# Patient Record
Sex: Male | Born: 1960 | Race: Black or African American | Hispanic: No | Marital: Married | Smoking: Never smoker
Health system: Southern US, Community
[De-identification: ages and names within clinical notes are randomized; demographics above are authoritative.]

## PROBLEM LIST (undated history)

## (undated) DIAGNOSIS — E119 Type 2 diabetes mellitus without complications: Secondary | ICD-10-CM

---

## 1998-02-13 ENCOUNTER — Emergency Department (HOSPITAL_COMMUNITY): Admission: EM | Admit: 1998-02-13 | Discharge: 1998-02-13 | Payer: Self-pay | Admitting: *Deleted

## 2001-02-21 ENCOUNTER — Emergency Department (HOSPITAL_COMMUNITY): Admission: EM | Admit: 2001-02-21 | Discharge: 2001-02-21 | Payer: Self-pay | Admitting: Emergency Medicine

## 2001-02-21 ENCOUNTER — Encounter: Payer: Self-pay | Admitting: Emergency Medicine

## 2001-11-01 ENCOUNTER — Emergency Department (HOSPITAL_COMMUNITY): Admission: EM | Admit: 2001-11-01 | Discharge: 2001-11-01 | Payer: Self-pay | Admitting: *Deleted

## 2002-01-06 ENCOUNTER — Encounter: Payer: Self-pay | Admitting: Emergency Medicine

## 2002-01-06 ENCOUNTER — Emergency Department (HOSPITAL_COMMUNITY): Admission: EM | Admit: 2002-01-06 | Discharge: 2002-01-06 | Payer: Self-pay | Admitting: Emergency Medicine

## 2003-01-04 ENCOUNTER — Emergency Department (HOSPITAL_COMMUNITY): Admission: EM | Admit: 2003-01-04 | Discharge: 2003-01-04 | Payer: Self-pay | Admitting: *Deleted

## 2003-06-28 ENCOUNTER — Encounter: Payer: Self-pay | Admitting: Internal Medicine

## 2003-06-28 ENCOUNTER — Encounter: Admission: RE | Admit: 2003-06-28 | Discharge: 2003-06-28 | Payer: Self-pay | Admitting: Internal Medicine

## 2004-09-13 ENCOUNTER — Encounter: Admission: RE | Admit: 2004-09-13 | Discharge: 2004-09-13 | Payer: Self-pay | Admitting: Occupational Medicine

## 2004-10-01 ENCOUNTER — Encounter: Admission: RE | Admit: 2004-10-01 | Discharge: 2004-11-04 | Payer: Self-pay | Admitting: Nurse Practitioner

## 2005-09-08 ENCOUNTER — Emergency Department (HOSPITAL_COMMUNITY): Admission: EM | Admit: 2005-09-08 | Discharge: 2005-09-08 | Payer: Self-pay | Admitting: Family Medicine

## 2006-09-04 ENCOUNTER — Ambulatory Visit (HOSPITAL_COMMUNITY): Admission: RE | Admit: 2006-09-04 | Discharge: 2006-09-04 | Payer: Self-pay | Admitting: Podiatry

## 2006-12-07 ENCOUNTER — Encounter: Admission: RE | Admit: 2006-12-07 | Discharge: 2006-12-30 | Payer: Self-pay | Admitting: Orthopedic Surgery

## 2006-12-17 ENCOUNTER — Ambulatory Visit (HOSPITAL_COMMUNITY): Admission: RE | Admit: 2006-12-17 | Discharge: 2006-12-18 | Payer: Self-pay | Admitting: Orthopedic Surgery

## 2006-12-17 ENCOUNTER — Encounter (INDEPENDENT_AMBULATORY_CARE_PROVIDER_SITE_OTHER): Payer: Self-pay | Admitting: *Deleted

## 2007-09-16 ENCOUNTER — Emergency Department (HOSPITAL_COMMUNITY): Admission: EM | Admit: 2007-09-16 | Discharge: 2007-09-16 | Payer: Self-pay | Admitting: Emergency Medicine

## 2007-10-22 ENCOUNTER — Encounter: Admission: RE | Admit: 2007-10-22 | Discharge: 2007-10-22 | Payer: Self-pay | Admitting: Occupational Medicine

## 2008-08-28 ENCOUNTER — Encounter: Admission: RE | Admit: 2008-08-28 | Discharge: 2008-08-28 | Payer: Self-pay | Admitting: Occupational Medicine

## 2008-09-11 ENCOUNTER — Encounter: Admission: RE | Admit: 2008-09-11 | Discharge: 2008-09-11 | Payer: Self-pay | Admitting: Internal Medicine

## 2008-09-22 ENCOUNTER — Ambulatory Visit (HOSPITAL_COMMUNITY): Admission: RE | Admit: 2008-09-22 | Discharge: 2008-09-22 | Payer: Self-pay | Admitting: Internal Medicine

## 2008-10-09 ENCOUNTER — Ambulatory Visit: Payer: Self-pay | Admitting: Sports Medicine

## 2008-10-09 DIAGNOSIS — S93409A Sprain of unspecified ligament of unspecified ankle, initial encounter: Secondary | ICD-10-CM | POA: Insufficient documentation

## 2008-10-09 DIAGNOSIS — M25579 Pain in unspecified ankle and joints of unspecified foot: Secondary | ICD-10-CM

## 2008-10-09 DIAGNOSIS — M2140 Flat foot [pes planus] (acquired), unspecified foot: Secondary | ICD-10-CM | POA: Insufficient documentation

## 2008-10-09 DIAGNOSIS — M7750 Other enthesopathy of unspecified foot: Secondary | ICD-10-CM | POA: Insufficient documentation

## 2008-10-10 ENCOUNTER — Encounter: Payer: Self-pay | Admitting: Sports Medicine

## 2008-10-18 ENCOUNTER — Encounter: Payer: Self-pay | Admitting: Sports Medicine

## 2008-11-06 ENCOUNTER — Ambulatory Visit: Payer: Self-pay | Admitting: Sports Medicine

## 2008-11-10 ENCOUNTER — Encounter (INDEPENDENT_AMBULATORY_CARE_PROVIDER_SITE_OTHER): Payer: Self-pay | Admitting: *Deleted

## 2008-11-10 ENCOUNTER — Ambulatory Visit: Payer: Self-pay | Admitting: Sports Medicine

## 2008-11-10 DIAGNOSIS — E669 Obesity, unspecified: Secondary | ICD-10-CM | POA: Insufficient documentation

## 2008-11-24 ENCOUNTER — Ambulatory Visit: Payer: Self-pay | Admitting: Sports Medicine

## 2008-12-29 ENCOUNTER — Ambulatory Visit: Payer: Self-pay | Admitting: Sports Medicine

## 2009-01-26 ENCOUNTER — Ambulatory Visit: Payer: Self-pay | Admitting: Sports Medicine

## 2009-01-29 ENCOUNTER — Ambulatory Visit (HOSPITAL_COMMUNITY): Admission: RE | Admit: 2009-01-29 | Discharge: 2009-01-29 | Payer: Self-pay | Admitting: Internal Medicine

## 2009-02-06 ENCOUNTER — Encounter: Payer: Self-pay | Admitting: Sports Medicine

## 2009-03-09 ENCOUNTER — Ambulatory Visit: Payer: Self-pay | Admitting: Sports Medicine

## 2010-01-09 IMAGING — CR DG ANKLE COMPLETE 3+V*L*
3 series · 3 of 3 positions shown · non-contrast
Comparison: CT of the left ankle of 09/04/2006

CLINICAL DATA: Twisted ankle with severe pain laterally

LEFT ANKLE COMPLETE - 3+ VIEW

[view not recorded (1 of 3)]
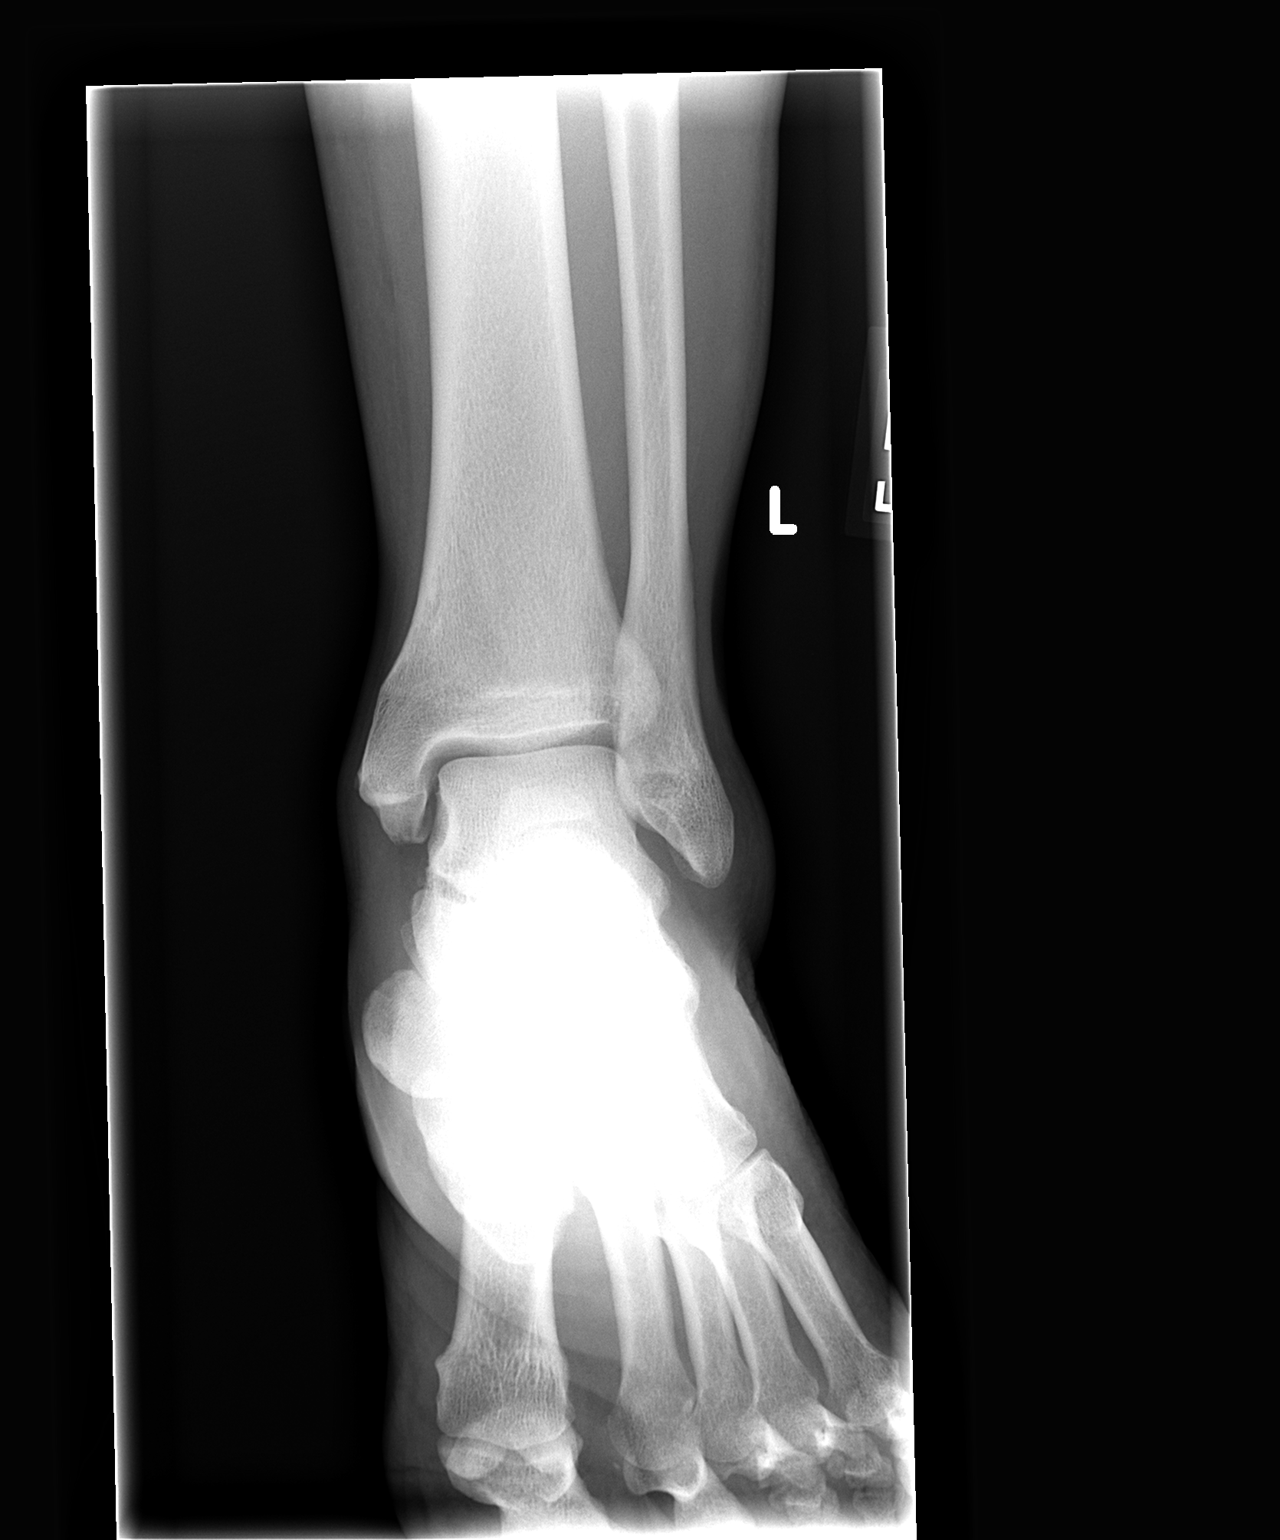

[view not recorded (2 of 3)]
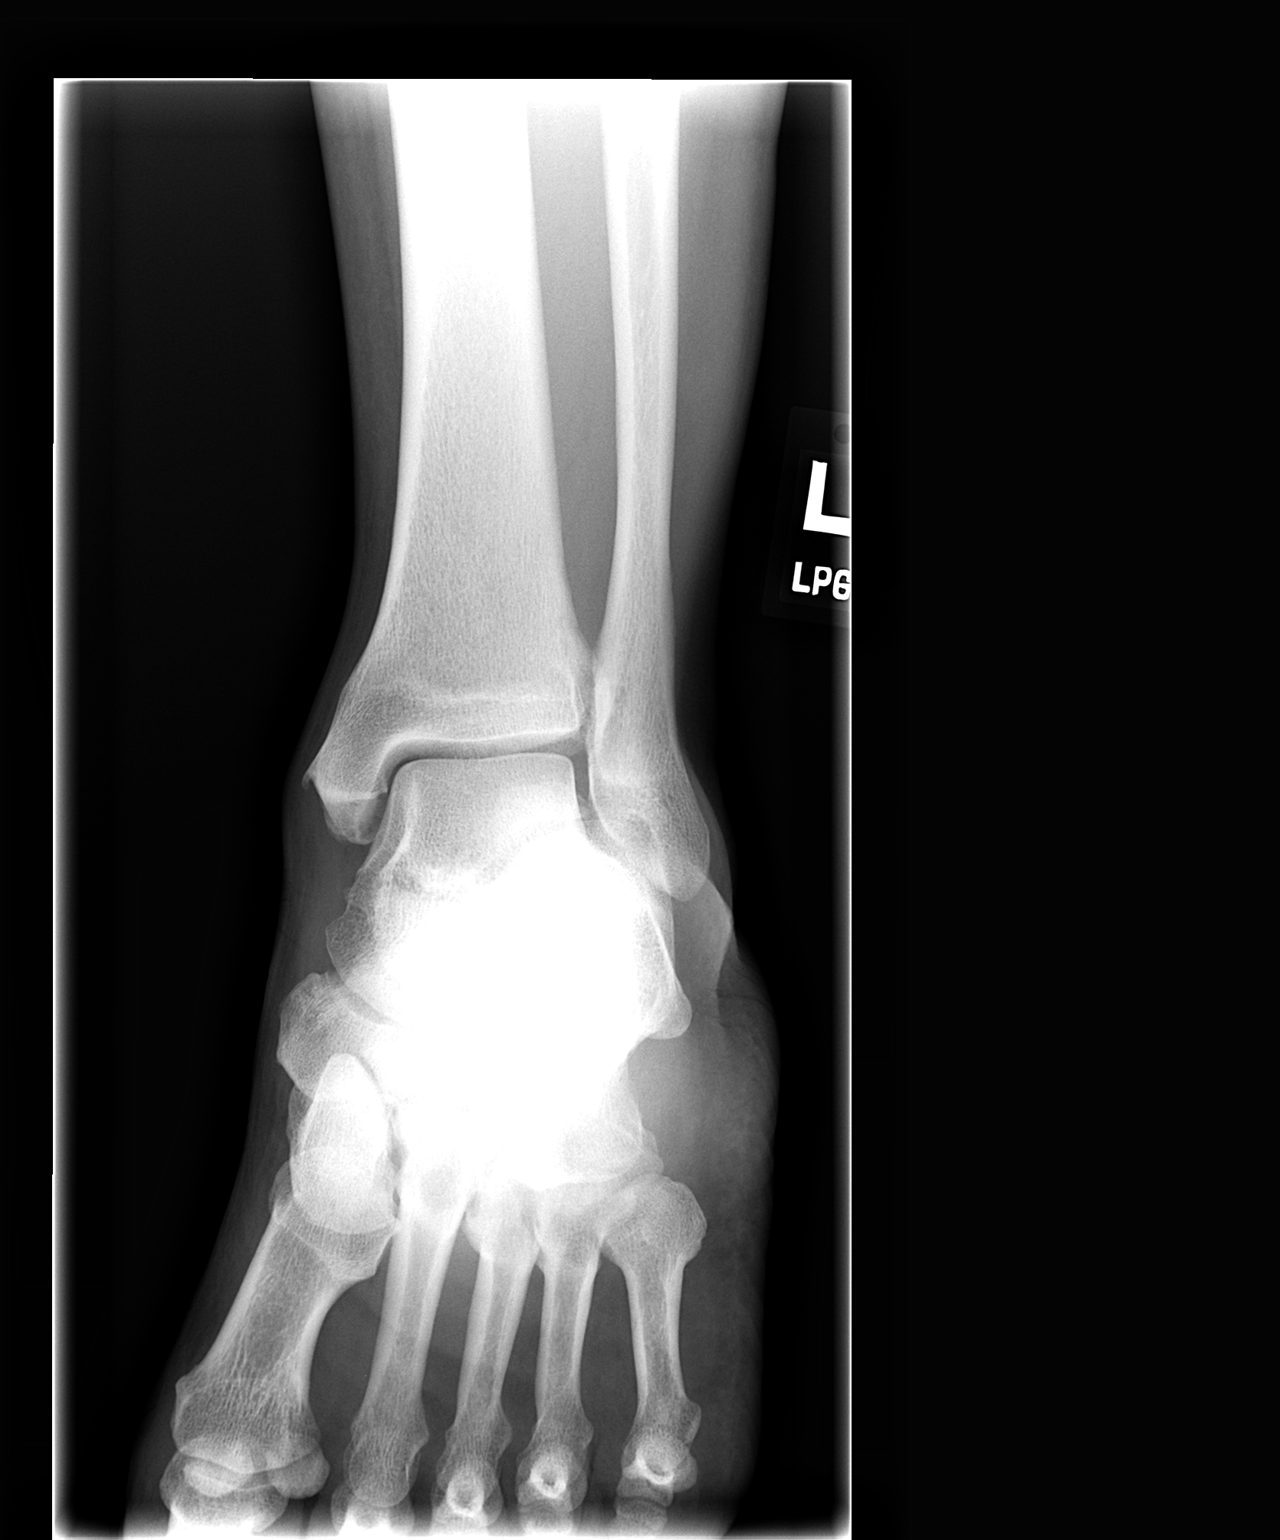

[view not recorded (3 of 3)]
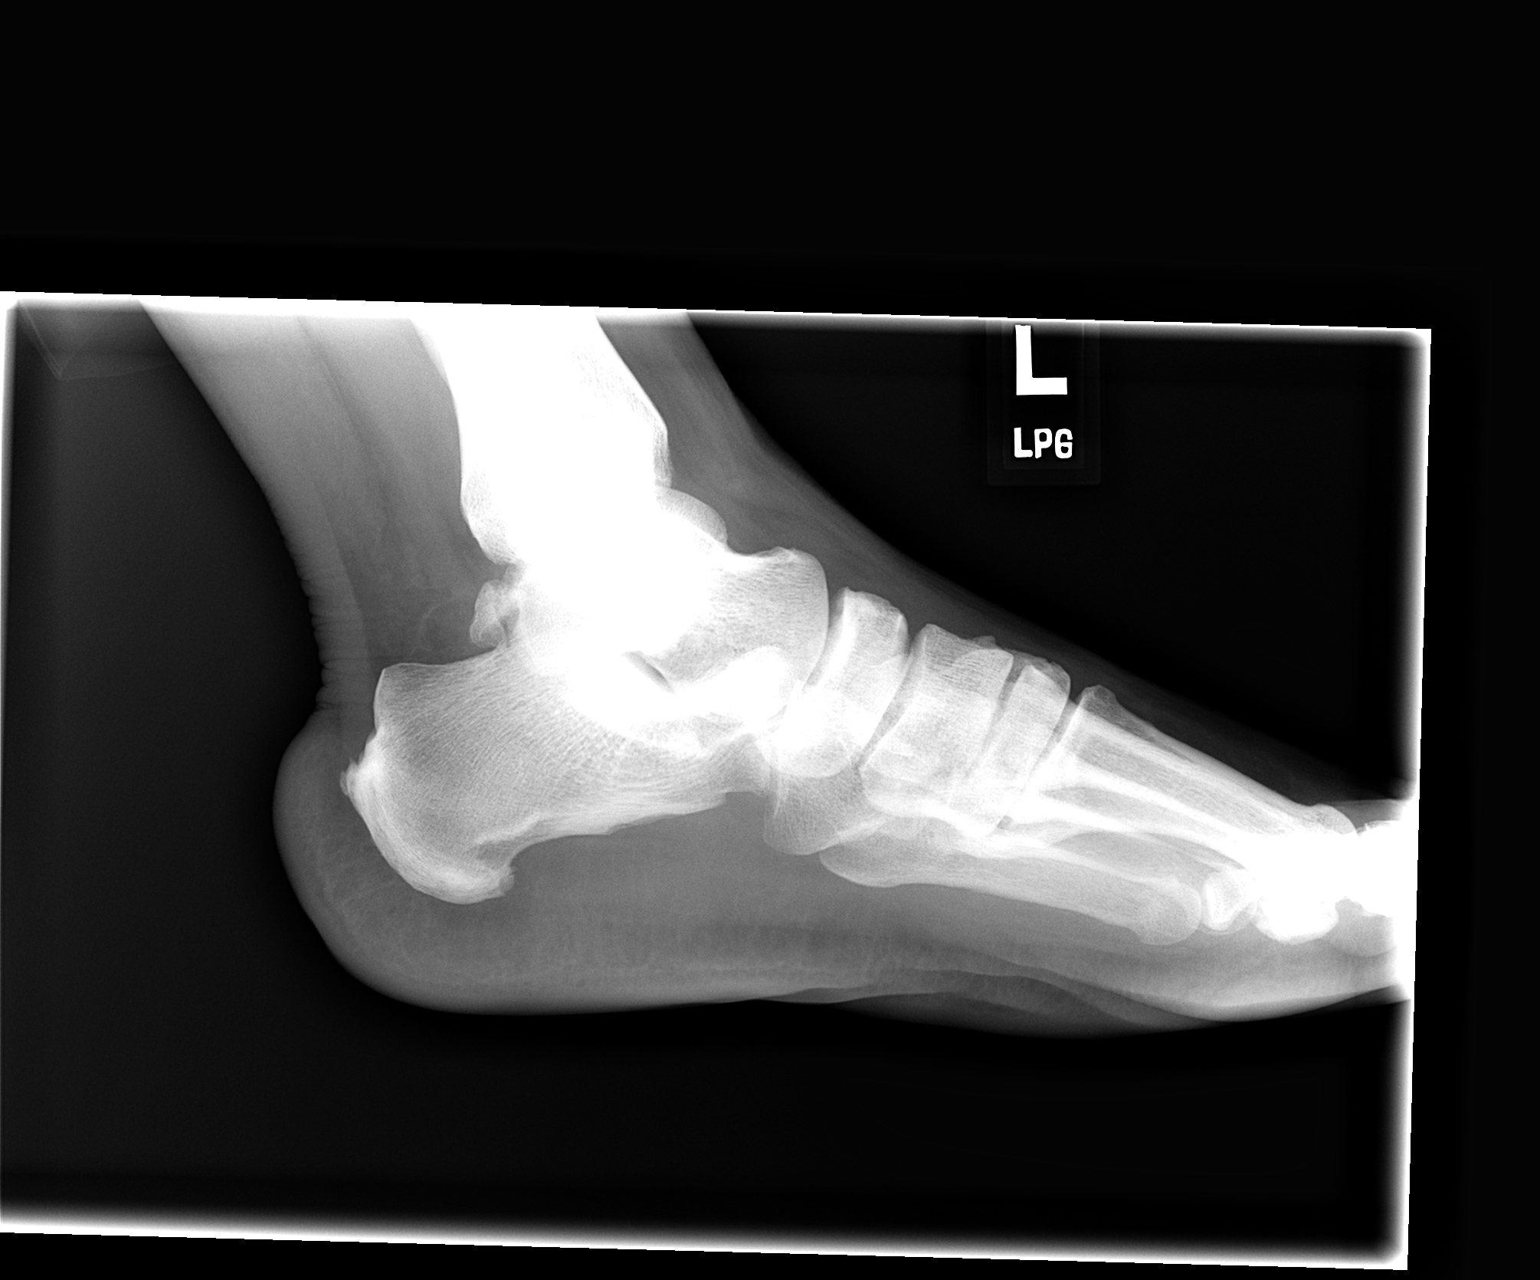

[3 of 3 positions shown; findings below may reference images not displayed]

FINDINGS: No acute fracture is seen and the ankle joint appears
normal.  Alignment is normal.
IMPRESSION: No acute abnormality.

## 2011-02-24 ENCOUNTER — Encounter: Payer: Self-pay | Admitting: *Deleted

## 2011-03-21 NOTE — Op Note (Signed)
NAMEDONTRAY, [place]ABER[place] NO.:  [id number]   MEDICAL RECORD NO.:  [id number]          PATIENT TYPE:  INP   LOCATION:  1515                         FACILITY:  [facility]   PHYSICIAN:  Marlowe Kays, M.D.  DATE OF BIRTH:  1961/04/20   DATE OF PROCEDURE:  12/17/2006  DATE OF DISCHARGE:                               OPERATIVE REPORT   PREOP DIAGNOSIS:  Painful osteochondral lesion right talus.   POSTOP DIAGNOSIS:  Painful osteochondral lesion right talus.   OPERATION:  Excision of osteochondral lesion right talus with repair of  Achilles tendon.   SURGEON:  Marlowe Kays, M.D.   ASSISTANTDruscilla Brownie. Idolina Primer, P.A.-C   ANESTHESIA:  General   INDICATION FOR PROCEDURE:  He has had pain in his right ankle and heel  area for a number of years, worse recently.  Workup including CT scan  neurologic testing and inflammatory testing was all negative.  It was  not until we obtained an MRI of the ankle on 11/05/2006 that we could  see an 11 x 6 mm osteochondral lesion in the posterior medial talus with  extensive surrounding edema in the talus.  He is here today for surgical  correction.  Because of the location of the lesion which was posterior  to the superior dome of the talus, I had to go in posteriorly; which  necessitated cutting the Achilles tendon with repair.   DESCRIPTION OF PROCEDURE:  Satisfactory spinal anesthesia, pneumatic  tourniquet applied while the supine position.  He was then placed in the  prone position on rolls and the right leg Esmarched out, nonsterilely,  and the tourniquet inflated to 350 mmHg.  I made a vertical incision  lateral to the Achilles tendon, which I isolated; and then cut it in Z-  fashion tagging the two ends for repair later.  With a combination of  blunt sharp dissection and using a C-arm, I explored the posterior ankle  retracting the flexor halluces medially to protect the neurovascular  bundle.  The talotibial joint was  located; and with sequential x-rays.  I kept mobilizing the soft tissues until I was able to get to the medial  talus.  At this point, the articular surface had a corrugated  appearance, roughly the size of the lesion.  A localizing x-ray with  needle and C-arm indicated that we were right where the lesion was  located on the MRI.  Once, again, there was nothing visible on plain x-  ray.   I made a trap-door window with 1/4-inch curved osteotome, and saved the  trap-door for replanting later.  I then used several curettes, curetting  out what was a very soft bone marrow-type material.  There may have been  some yellowish areas in it.  No definite cyst was located; but after  curetting out a size of lesion that was roughly what was noted on the  MRI, the surrounding perimeter of the curetted area was all solid, which  was completely different from what I had curetted out.  I then irrigated  the wound well with sterile saline, and packed cancellus allograft  into  the scooped out area; and then replaced the trap door.  I impacted  slightly, but also placed Gelfoam on top to help secure it.  There wound  was then once again irrigated well.  The heel cord Z-cut was repaired  with multiple interrupted #1 Ethibond with the ankle in plantar flexion.  The subcu tissue with 2-0 Vicryl, the skin with running 3-0 nylon talar  stitch.  Betadine prep Adaptic, and a dry sterile dressing, and a well-  padded short-leg splint cast with the ankle in somewhat __________ was  applied.  Tourniquet was released.  He tolerated the procedure well; and  was taken to the recovery room in satisfactory condition with no known  complications.           ______________________________  Marlowe Kays, M.D.     JA/MEDQ  D:  12/17/2006  T:  12/17/2006  Job:  [id number]

## 2013-05-17 ENCOUNTER — Encounter (HOSPITAL_COMMUNITY): Payer: Self-pay | Admitting: Family Medicine

## 2013-05-17 ENCOUNTER — Emergency Department (HOSPITAL_COMMUNITY)
Admission: EM | Admit: 2013-05-17 | Discharge: 2013-05-17 | Disposition: A | Discharge status: Home / Self Care | Payer: No Typology Code available for payment source | Attending: Emergency Medicine | Admitting: Emergency Medicine

## 2013-05-17 ENCOUNTER — Emergency Department (HOSPITAL_COMMUNITY): Payer: No Typology Code available for payment source

## 2013-05-17 DIAGNOSIS — IMO0002 Reserved for concepts with insufficient information to code with codable children: Secondary | ICD-10-CM | POA: Insufficient documentation

## 2013-05-17 DIAGNOSIS — Z794 Long term (current) use of insulin: Secondary | ICD-10-CM | POA: Insufficient documentation

## 2013-05-17 DIAGNOSIS — S0993XA Unspecified injury of face, initial encounter: Secondary | ICD-10-CM | POA: Insufficient documentation

## 2013-05-17 DIAGNOSIS — Y9389 Activity, other specified: Secondary | ICD-10-CM | POA: Insufficient documentation

## 2013-05-17 DIAGNOSIS — Z7982 Long term (current) use of aspirin: Secondary | ICD-10-CM | POA: Insufficient documentation

## 2013-05-17 DIAGNOSIS — Y92410 Unspecified street and highway as the place of occurrence of the external cause: Secondary | ICD-10-CM | POA: Insufficient documentation

## 2013-05-17 DIAGNOSIS — E119 Type 2 diabetes mellitus without complications: Secondary | ICD-10-CM | POA: Insufficient documentation

## 2013-05-17 DIAGNOSIS — S199XXA Unspecified injury of neck, initial encounter: Secondary | ICD-10-CM | POA: Insufficient documentation

## 2013-05-17 DIAGNOSIS — Z79899 Other long term (current) drug therapy: Secondary | ICD-10-CM | POA: Insufficient documentation

## 2013-05-17 HISTORY — DX: Type 2 diabetes mellitus without complications: E11.9

## 2013-05-17 MED ORDER — OXYCODONE-ACETAMINOPHEN 5-325 MG PO TABS
2.0000 | ORAL_TABLET | Freq: Once | ORAL | Status: AC
  Administered 2013-05-17: 2 via ORAL
  Filled 2013-05-17: qty 2

## 2013-05-17 MED ORDER — ONDANSETRON 4 MG PO TBDP
8.0000 mg | ORAL_TABLET | Freq: Once | ORAL | Status: AC
  Administered 2013-05-17: 8 mg via ORAL
  Filled 2013-05-17: qty 2

## 2013-05-17 MED ORDER — PROMETHAZINE HCL 25 MG PO TABS: 25.0000 mg | ORAL_TABLET | Freq: Four times a day (QID) | ORAL | Status: DC | PRN

## 2013-05-17 MED ORDER — OXYCODONE-ACETAMINOPHEN 5-325 MG PO TABS: 2.0000 | ORAL_TABLET | ORAL | Status: DC | PRN

## 2013-05-17 NOTE — ED Notes (Signed)
PT returned from CT
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
## 2013-05-18 NOTE — ED Provider Notes (Signed)
Medical screening examination/treatment/procedure(s) were performed by non-physician practitioner and as supervising physician I was immediately available for consultation/collaboration.  Taleyah Hillman T Jaysa Kise, MD 05/18/13 1531 

## 2014-07-12 ENCOUNTER — Encounter: Payer: Self-pay | Admitting: Podiatry

## 2014-07-12 ENCOUNTER — Ambulatory Visit (INDEPENDENT_AMBULATORY_CARE_PROVIDER_SITE_OTHER): Payer: BC Managed Care – PPO | Admitting: Podiatry

## 2014-07-12 VITALS — BP 122/76 | HR 73 | Resp 17

## 2014-07-12 DIAGNOSIS — M79609 Pain in unspecified limb: Secondary | ICD-10-CM

## 2014-07-12 DIAGNOSIS — M79676 Pain in unspecified toe(s): Secondary | ICD-10-CM

## 2014-07-12 DIAGNOSIS — B351 Tinea unguium: Secondary | ICD-10-CM

## 2014-07-12 NOTE — Patient Instructions (Signed)
Diabetes and Foot Care Diabetes may cause you to have problems because of poor blood supply (circulation) to your feet and legs. This may cause the skin on your feet to become thinner, break easier, and heal more slowly. Your skin may become dry, and the skin may peel and crack. You may also have nerve damage in your legs and feet causing decreased feeling in them. You may not notice minor injuries to your feet that could lead to infections or more serious problems. Taking care of your feet is one of the most important things you can do for yourself.  HOME CARE INSTRUCTIONS  Wear shoes at all times, even in the house. Do not go barefoot. Bare feet are easily injured.  Check your feet daily for blisters, cuts, and redness. If you cannot see the bottom of your feet, use a mirror or ask someone for help.  Wash your feet with warm water (do not use hot water) and mild soap. Then pat your feet and the areas between your toes until they are completely dry. Do not soak your feet as this can dry your skin.  Apply a moisturizing lotion or petroleum jelly (that does not contain alcohol and is unscented) to the skin on your feet and to dry, brittle toenails. Do not apply lotion between your toes.  Trim your toenails straight across. Do not dig under them or around the cuticle. File the edges of your nails with an emery board or nail file.  Do not cut corns or calluses or try to remove them with medicine.  Wear clean socks or stockings every day. Make sure they are not too tight. Do not wear knee-high stockings since they may decrease blood flow to your legs.  Wear shoes that fit properly and have enough cushioning. To break in new shoes, wear them for just a few hours a day. This prevents you from injuring your feet. Always look in your shoes before you put them on to be sure there are no objects inside.  Do not cross your legs. This may decrease the blood flow to your feet.  If you find a minor scrape,  cut, or break in the skin on your feet, keep it and the skin around it clean and dry. These areas may be cleansed with mild soap and water. Do not cleanse the area with peroxide, alcohol, or iodine.  When you remove an adhesive bandage, be sure not to damage the skin around it.  If you have a wound, look at it several times a day to make sure it is healing.  Do not use heating pads or hot water bottles. They may burn your skin. If you have lost feeling in your feet or legs, you may not know it is happening until it is too late.  Make sure your health care provider performs a complete foot exam at least annually or more often if you have foot problems. Report any cuts, sores, or bruises to your health care provider immediately. SEEK MEDICAL CARE IF:   You have an injury that is not healing.  You have cuts or breaks in the skin.  You have an ingrown nail.  You notice redness on your legs or feet.  You feel burning or tingling in your legs or feet.  You have pain or cramps in your legs and feet.  Your legs or feet are numb.  Your feet always feel cold. SEEK IMMEDIATE MEDICAL CARE IF:   There is increasing redness,   swelling, or pain in or around a wound.  There is a red line that goes up your leg.  Pus is coming from a wound.  You develop a fever or as directed by your health care provider.  You notice a bad smell coming from an ulcer or wound. Document Released: 10/17/2000 Document Revised: 06/22/2013 Document Reviewed: 03/29/2013 ExitCare Patient Information 2015 [employer]. This information is not intended to replace advice given to you by your health care provider. Make sure you discuss any questions you have with your health care provider.  

## 2014-07-12 NOTE — Progress Notes (Signed)
   Subjective:    Patient ID: Anthony Robertson, male    DOB: 1961/02/08, 53 y.o.   MRN: [id number]  HPI Pt presents for diabetic foot exam, states that his blood sugars are under control, his last A1c was around 9. Denies any foot pain or issues. Denies any claudication symptoms or tingling/numbness. States he works out about 45 minutes a day for cardiovascular exercise. No complaints at this time.   Review of Systems  All other systems reviewed and are negative.      Objective:   Physical Exam AAO x3, NAD DP/PT pulses palpable 2/4 b/l. CRT , 3 < 3sec Protective sensation intact with Simms Weinstein monofilament, vibratory sensation intact, Achilles tendon reflex intact. Bilateral hallux, second, fifth digit nails hypertrophic, dystrophic, elongated, discolored. No surrounding erythema or drainage. No hyperkeratotic lesions. No ulcerative or pre-ulcerative lesions. No leg pain, swelling, warmth. MMT 5/5, ROM WNL. Decrease in medial arch height upon weightbearing bilaterally. Equinus.      Assessment & Plan:  53 year old male with history diabetes and symptomatic onychomycosis/nail dystrophy.  -Treatment options discussed including alternatives, risks, complications.  -Nails sharply debrided without complications.  -Recommend a supportive shoe gear help support his foot type. -Discussed over-the-counter versus prescription therapy for possible onychomycosis. At this time we'll proceed with over-the-counter therapy. -The patient is doing well and no history of ulceration, PVD, neuropathy. Recommend at least yearly followup for diabetic risk assessment.  -Call sooner with any questions or concerns or any change in symptoms.

## 2018-07-29 ENCOUNTER — Ambulatory Visit
Admission: RE | Admit: 2018-07-29 | Discharge: 2018-07-29 | Disposition: A | Discharge status: Home / Self Care | Payer: BC Managed Care – PPO | Source: Ambulatory Visit | Attending: Family | Admitting: Family

## 2018-07-29 ENCOUNTER — Other Ambulatory Visit: Payer: Self-pay | Admitting: Family

## 2018-07-29 DIAGNOSIS — R05 Cough: Secondary | ICD-10-CM

## 2018-07-29 DIAGNOSIS — R059 Cough, unspecified: Secondary | ICD-10-CM

## 2018-08-02 ENCOUNTER — Encounter: Payer: Self-pay | Admitting: *Deleted

## 2018-08-02 ENCOUNTER — Ambulatory Visit (INDEPENDENT_AMBULATORY_CARE_PROVIDER_SITE_OTHER): Payer: BC Managed Care – PPO | Admitting: Internal Medicine

## 2018-08-02 ENCOUNTER — Encounter: Payer: Self-pay | Admitting: Internal Medicine

## 2018-08-02 VITALS — BP 140/84 | HR 110 | Ht 66.25 in | Wt 206.0 lb

## 2018-08-02 DIAGNOSIS — I10 Essential (primary) hypertension: Secondary | ICD-10-CM

## 2018-08-02 DIAGNOSIS — R058 Other specified cough: Secondary | ICD-10-CM

## 2018-08-02 DIAGNOSIS — R05 Cough: Secondary | ICD-10-CM

## 2018-08-02 MED ORDER — CEFDINIR 300 MG PO CAPS: 300.0000 mg | ORAL_CAPSULE | Freq: Two times a day (BID) | ORAL | 0 refills | Status: AC

## 2018-08-02 MED ORDER — TELMISARTAN 40 MG PO TABS: 40.0000 mg | ORAL_TABLET | Freq: Every day | ORAL | 2 refills | Status: AC

## 2018-08-02 MED ORDER — OXYCODONE-ACETAMINOPHEN 5-325 MG PO TABS: 1.0000 | ORAL_TABLET | ORAL | 0 refills | Status: AC | PRN

## 2018-08-02 NOTE — Progress Notes (Signed)
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     08/02/2018  Pulmonary/  1st office eval  Re refractory cough on ACEi  Chief Complaint  Patient presents with  . Pulmonary Consult    Referred by Kayren Eaves, NP. Pt c/o cough off and on for the past 8 years. Cough is prod with dark green to brown sputum and is esp worse at night.   Dyspnea:  Fine unless coughing/ gen cp with coughing fits only  Cough: 24/7 esp at hs last abx 08/01/18 > augmentin per pharmacy records some better, still some discoloration  Sleep: on back / on side/ no elevation 4 pillows SABA use: none    No obvious day to day or daytime variability or assoc  mucus plugs or hemoptysis or   chest tightness, subjective wheeze or overt sinus or hb symptoms.    . Also denies any obvious fluctuation of symptoms with weather or environmental changes or other aggravating or alleviating factors except as outlined above   No unusual exposure hx or h/o childhood pna/ asthma or knowledge of premature birth.  Current Allergies, Complete Past Medical History, Past Surgical History, Family History, and Social History were reviewed in [employer] record.  ROS  The following are not active complaints unless bolded Hoarseness, sore throat, dysphagia, dental problems, itching, sneezing,  nasal congestion or discharge of excess mucus or purulent secretions, ear ache,   fever, chills, sweats, unintended wt loss or wt gain, classically  pleuritic or exertional cp,  orthopnea pnd or arm/hand swelling  or leg swelling, presyncope, palpitations, abdominal pain, anorexia, nausea, vomiting, diarrhea  or change in bowel habits or change in bladder habits, change in stools or change in urine, dysuria, hematuria,  rash, arthralgias, visual complaints, headache, numbness, weakness or ataxia or problems with walking or coordination,  change in mood or  memory.           Past Medical History:  Diagnosis Date  . Diabetes mellitus without complication [facility])     Outpatient Medications Prior to Visit  Medication Sig Dispense Refill  . aspirin 81 MG EC tablet Take 81 mg by mouth at bedtime. Swallow whole.    . insulin aspart (NOVOLOG) 100 UNIT/ML injection Inject 22 Units into the skin at bedtime.    Marland Kitchen lisinopril (PRINIVIL,ZESTRIL) 10 MG tablet Take 10 mg by mouth daily.    Marland Kitchen METFORMIN HCL PO Take by mouth daily. Unsure of strength    . oxyCODONE-acetaminophen (PERCOCET/ROXICET) 5-325 MG per tablet Take 2 tablets by mouth every 4 (four) hours as needed for pain. 6 tablet 0  . Semaglutide,0.25 or 0.5MG /DOS, (OZEMPIC, 0.25 OR 0.5 MG/DOSE,) 2 MG/1.5ML SOPN Inject into the skin every 7 (seven) days.    . promethazine (PHENERGAN) 25 MG tablet Take 1 tablet (25 mg total) by mouth every 6 (six) hours as needed for nausea. 12 tablet 0  . Saxagliptin-Metformin (KOMBIGLYZE XR) 5-500 MG TB24 Take 1 tablet by  mouth 2 (two) times daily.     No facility-administered medications prior to visit.           Objective:     BP 140/84 (BP Location: Left Arm, Cuff Size: Normal)   Pulse (!) 110   Ht 5' 6.25" (1.683 m)   Wt 206 lb (93.4 kg)   SpO2 98%   BMI 33.00 kg/m   SpO2: 98 % RA  Very hoarse wm nad  pseudowheeze only / resolves with plm    HEENT: nl dentition,   and oropharynx. Nl external ear canals without cough reflex - moderate bilateral non-specific turbinate edema  With mp secretions on L    NECK :  without JVD/Nodes/TM/ nl carotid  upstrokes bilaterally   LUNGS: no acc muscle use,  Nl contour chest which is clear to A and P bilaterally without cough on insp or exp maneuvers   CV:  RRR  no s3 or murmur or increase in P2, and no edema   ABD:  Obese/ soft and nontender with nl inspiratory excursion in the supine position. No bruits or organomegaly appreciated, bowel sounds nl  MS:  Nl gait/ ext warm without deformities, calf tenderness, cyanosis or clubbing No obvious joint restrictions   SKIN: warm and dry without lesions    NEURO:  alert, approp, nl sensorium with  no motor or cerebellar deficits apparent.   I personally reviewed images and agree with radiology impression as follows:  CXR:   07/29/18 No focal pneumonia. Mild linear atelectasis of left medial lung base.     Assessment   Upper airway cough syndrome Onset early sept 2019  rx'd  for sinusitis with augmentin x 12 days thru 08/01/18 but still purulent nasal d/c 08/02/2018 so added 10 more days of omnicef then CT sinus if not better - d/c'd acei 08/02/2018   Upper airway cough syndrome (previously labeled PNDS),  is so named because it's frequently impossible to sort out how much is  CR/sinusitis with freq throat clearing (which can be related to primary GERD)   vs  causing  secondary (" extra esophageal")  GERD from wide swings in gastric pressure that occur with throat clearing, often  promoting self use of mint and menthol lozenges that reduce the lower esophageal sphincter tone and exacerbate the problem further in a cyclical fashion.     These are the same pts (now being labeled as having "irritable larynx syndrome" by some cough centers) who not infrequently have a history of having failed to tolerate ace inhibitors(see hbp a/p),  dry powder inhalers or biphosphonates or report having atypical/extraesophageal reflux symptoms that don't respond to standard doses of PPI  and are easily confused as having aecopd or asthma flares by even experienced  allergists/ pulmonologists (myself included).    rec max rx for sinusitis and off acei then add gerd rx and check CT if not better in 10 days / use max mucinex dm and supplement prn percocet for cough /  pain from coughing in meantime    Essential hypertension Trial off ACEi rec 08/02/2018    In the best review of chronic cough to date ( NEJM 2016 375 9528-4132) ,  ACEi are now felt to cause cough in up to  20% of pts which is a 4 fold increase from previous reports and does not include the variety of non-specific complaints we see in pulmonary clinic in pts on ACEi but previously attributed to another dx like  Copd/asthma and  include  PNDS, throat and chest congestion, "bronchitis", unexplained dyspnea and noct "strangling" sensations, and hoarseness, but also  atypical /refractory GERD symptoms like dysphagia and "bad heartburn"   The only way I know  to prove this is not an "ACEi Case" is a trial off ACEi x a minimum of 4 weeks then regroup.   Will try substitute micardis 40 mg daily and regroup in 4 weeks     Total time devoted to counseling  > 50 % of initial 60 min office visit:  review case with pt/ discussion of options/alternatives/ personally creating written customized instructions  in presence of pt  then going over those specific  Instructions directly with the pt including how to use all of the meds but in particular covering each new medication in detail and the difference between the maintenance= "automatic" meds and the prns using an action plan format for the latter (If this problem/symptom => do that organization reading Left to right).  Please see AVS from this visit for a full list of these instructions which I personally wrote for this pt and  are unique to this visit.      Sandrea Hughs, MD 08/02/2018

## 2018-08-02 NOTE — Patient Instructions (Addendum)
Stop lisinopril and start micardis 40 mg one tablet daily   omnicef 300 mg  Take One twice daily x 10 days then call for sinus ct if not all better  Take  mucinex DM every 12 hours and supplement if needed with  Percocet up to 2 every 4 hours to suppress the urge to cough. Swallowing water and/or using ice chips/non mint and menthol containing candies (such as lifesavers or sugarless jolly ranchers) are also effective.  You should rest your voice and avoid activities that you know make you cough.  Once you have eliminated the cough for 3 straight days try reducing the percocet  first,  then the Mucinex dm  as tolerated.   Please schedule a follow up office visit in 4 weeks, sooner if needed

## 2018-08-03 ENCOUNTER — Encounter: Payer: Self-pay | Admitting: Internal Medicine

## 2018-08-03 DIAGNOSIS — R05 Cough: Secondary | ICD-10-CM | POA: Insufficient documentation

## 2018-08-03 DIAGNOSIS — I10 Essential (primary) hypertension: Secondary | ICD-10-CM | POA: Insufficient documentation

## 2018-08-03 DIAGNOSIS — R058 Other specified cough: Secondary | ICD-10-CM | POA: Insufficient documentation

## 2018-08-03 NOTE — Assessment & Plan Note (Signed)
Trial off ACEi rec 08/02/2018    In the best review of chronic cough to date ( NEJM 2016 375 [id number]) ,  ACEi are now felt to cause cough in up to  20% of pts which is a 4 fold increase from previous reports and does not include the variety of non-specific complaints we see in pulmonary clinic in pts on ACEi but previously attributed to another dx like  Copd/asthma and  include PNDS, throat and chest congestion, "bronchitis", unexplained dyspnea and noct "strangling" sensations, and hoarseness, but also  atypical /refractory GERD symptoms like dysphagia and "bad heartburn"   The only way I know  to prove this is not an "ACEi Case" is a trial off ACEi x a minimum of 4 weeks then regroup.   Will try substitute micardis 40 mg daily and regroup in 4 weeks     Total time devoted to counseling  > 50 % of initial 60 min office visit:  review case with pt/ discussion of options/alternatives/ personally creating written customized instructions  in presence of pt  then going over those specific  Instructions directly with the pt including how to use all of the meds but in particular covering each new medication in detail and the difference between the maintenance= "automatic" meds and the prns using an action plan format for the latter (If this problem/symptom => do that organization reading Left to right).  Please see AVS from this visit for a full list of these instructions which I personally wrote for this pt and  are unique to this visit.

## 2018-08-03 NOTE — Assessment & Plan Note (Addendum)
Onset early sept 2019  rx'd  for sinusitis with augmentin x 12 days thru 08/01/18 but still purulent nasal d/c 08/02/2018 so added 10 more days of omnicef then CT sinus if not better - d/c'd acei 08/02/2018   Upper airway cough syndrome (previously labeled PNDS),  is so named because it's frequently impossible to sort out how much is  CR/sinusitis with freq throat clearing (which can be related to primary GERD)   vs  causing  secondary (" extra esophageal")  GERD from wide swings in gastric pressure that occur with throat clearing, often  promoting self use of mint and menthol lozenges that reduce the lower esophageal sphincter tone and exacerbate the problem further in a cyclical fashion.     These are the same pts (now being labeled as having "irritable larynx syndrome" by some cough centers) who not infrequently have a history of having failed to tolerate ace inhibitors(see hbp a/p),  dry powder inhalers or biphosphonates or report having atypical/extraesophageal reflux symptoms that don't respond to standard doses of PPI  and are easily confused as having aecopd or asthma flares by even experienced allergists/ pulmonologists (myself included).    rec max rx for sinusitis and off acei then add gerd rx and check CT if not better in 10 days / use max mucinex dm and supplement prn percocet for cough /  pain from coughing in meantime

## 2018-08-30 ENCOUNTER — Ambulatory Visit: Payer: BC Managed Care – PPO | Admitting: Internal Medicine

## 2019-09-21 ENCOUNTER — Other Ambulatory Visit: Payer: Self-pay

## 2019-09-21 DIAGNOSIS — Z20822 Contact with and (suspected) exposure to covid-19: Secondary | ICD-10-CM

## 2019-09-23 ENCOUNTER — Telehealth: Payer: Self-pay | Admitting: Hematology

## 2019-09-23 LAB — NOVEL CORONAVIRUS, NAA: SARS-CoV-2, NAA: NOT DETECTED

## 2019-09-23 NOTE — Telephone Encounter (Signed)
Pt is aware covid 19 results is neg °

## 2019-09-26 ENCOUNTER — Other Ambulatory Visit: Payer: Self-pay | Admitting: Family

## 2019-09-26 ENCOUNTER — Ambulatory Visit
Admission: RE | Admit: 2019-09-26 | Discharge: 2019-09-26 | Disposition: A | Discharge status: Home / Self Care | Payer: BC Managed Care – PPO | Source: Ambulatory Visit | Attending: Family | Admitting: Family

## 2019-09-26 ENCOUNTER — Other Ambulatory Visit: Payer: Self-pay

## 2019-09-26 DIAGNOSIS — R05 Cough: Secondary | ICD-10-CM

## 2019-09-26 DIAGNOSIS — R059 Cough, unspecified: Secondary | ICD-10-CM

## 2019-09-26 DIAGNOSIS — R0602 Shortness of breath: Secondary | ICD-10-CM

## 2019-09-26 DIAGNOSIS — R197 Diarrhea, unspecified: Secondary | ICD-10-CM

## 2020-01-07 ENCOUNTER — Ambulatory Visit: Payer: BC Managed Care – PPO | Attending: Internal Medicine

## 2020-01-07 DIAGNOSIS — Z23 Encounter for immunization: Secondary | ICD-10-CM | POA: Insufficient documentation

## 2020-01-07 NOTE — Progress Notes (Signed)
   Covid-19 Vaccination Clinic  Name:  Anthony Robertson    MRN: [id number] DOB: 10/06/1961  01/07/2020  Mr. Anthony Robertson was observed post Covid-19 immunization for 15 minutes without incident. He was provided with Vaccine Information Sheet and instruction to access the V-Safe system.   Mr. Anthony Robertson was instructed to call 911 with any severe reactions post vaccine: Marland Kitchen Difficulty breathing  . Swelling of face and throat  . A fast heartbeat  . A bad rash all over body  . Dizziness and weakness   Immunizations Administered    Name Date Dose VIS Date Route   Pfizer COVID-19 Vaccine 01/07/2020 10:44 AM 0.3 mL 10/14/2019 Intramuscular   Manufacturer: [employer], [employer]   Lot: [id number]   NDC: [address]-7

## 2020-01-28 ENCOUNTER — Ambulatory Visit: Payer: BC Managed Care – PPO | Attending: Internal Medicine

## 2020-01-28 DIAGNOSIS — Z23 Encounter for immunization: Secondary | ICD-10-CM

## 2020-01-28 NOTE — Progress Notes (Signed)
   Covid-19 Vaccination Clinic  Name:  Anthony Robertson    MRN: [id number] DOB: 1961-08-23  01/28/2020  Anthony Robertson was observed post Covid-19 immunization for 15 minutes without incident. He was provided with Vaccine Information Sheet and instruction to access the V-Safe system.   Anthony Robertson was instructed to call 911 with any severe reactions post vaccine: Marland Kitchen Difficulty breathing  . Swelling of face and throat  . A fast heartbeat  . A bad rash all over body  . Dizziness and weakness   Immunizations Administered    Name Date Dose VIS Date Route   Pfizer COVID-19 Vaccine 01/28/2020 12:21 PM 0.3 mL 10/14/2019 Intramuscular   Manufacturer: [employer], [employer]   Lot: [id number]   NDC: [address]-0

## 2020-02-22 ENCOUNTER — Other Ambulatory Visit: Payer: Self-pay | Admitting: Family

## 2020-10-22 ENCOUNTER — Ambulatory Visit: Payer: BC Managed Care – PPO | Admitting: Podiatry

## 2021-07-18 ENCOUNTER — Encounter (HOSPITAL_BASED_OUTPATIENT_CLINIC_OR_DEPARTMENT_OTHER): Payer: Self-pay

## 2021-07-18 DIAGNOSIS — G4733 Obstructive sleep apnea (adult) (pediatric): Secondary | ICD-10-CM

## 2021-07-21 ENCOUNTER — Other Ambulatory Visit: Payer: Self-pay

## 2021-07-21 ENCOUNTER — Ambulatory Visit (HOSPITAL_BASED_OUTPATIENT_CLINIC_OR_DEPARTMENT_OTHER): Payer: BLUE CROSS/BLUE SHIELD | Attending: Nurse Practitioner | Admitting: Internal Medicine

## 2021-07-21 VITALS — Ht 65.0 in | Wt 209.0 lb

## 2021-07-21 DIAGNOSIS — G4733 Obstructive sleep apnea (adult) (pediatric): Secondary | ICD-10-CM | POA: Insufficient documentation

## 2021-07-28 DIAGNOSIS — G4733 Obstructive sleep apnea (adult) (pediatric): Secondary | ICD-10-CM

## 2021-07-28 NOTE — Procedures (Signed)
Patient Name: Anthony, Robertson Date: 07/21/2021 Gender: Male D.O.B: 1961/05/05 Age (years): 29 Referring Provider: Dayton Scrape FNP Height (inches): 65 Interpreting Physician: Jetty Duhamel MD, ABSM Weight (lbs): 209 RPSGT: Rosette Reveal BMI: 35 MRN: [id number] Neck Size: 16.50  CLINICAL INFORMATION Sleep Study Type: NPSG Indication for sleep study: Diabetes, OSA Epworth Sleepiness Score: 4  SLEEP STUDY TECHNIQUE As per the AASM Manual for the Scoring of Sleep and Associated Events v2.3 (April 2016) with a hypopnea requiring 4% desaturations.  The channels recorded and monitored were frontal, central and occipital EEG, electrooculogram (EOG), submentalis EMG (chin), nasal and oral airflow, thoracic and abdominal wall motion, anterior tibialis EMG, snore microphone, electrocardiogram, and pulse oximetry.  MEDICATIONS Medications self-administered by patient taken the night of the study : none reported  SLEEP ARCHITECTURE The study was initiated at 11:15:39 PM and ended at 5:24:12 AM.  Sleep onset time was 6.2 minutes and the sleep efficiency was 78.1%%. The total sleep time was 288 minutes.  Stage REM latency was 59.5 minutes.  The patient spent 14.4%% of the night in stage N1 sleep, 57.6%% in stage N2 sleep, 0.0%% in stage N3 and 28% in REM.  Alpha intrusion was absent.  Supine sleep was 9.20%.  RESPIRATORY PARAMETERS The overall apnea/hypopnea index (AHI) was 8.5 per hour. There were 5 total apneas, including 0 obstructive, 5 central and 0 mixed apneas. There were 36 hypopneas and 0 RERAs.  The AHI during Stage REM sleep was 13.4 per hour.  AHI while supine was 13.6 per hour.  The mean oxygen saturation was 89.8%. The minimum SpO2 during sleep was 81.0%.  snoring was noted during this study.  CARDIAC DATA The 2 lead EKG demonstrated sinus rhythm. The mean heart rate was 73.3 beats per minute. Other EKG findings include: PVCs.  LEG MOVEMENT  DATA The total PLMS were 0 with a resulting PLMS index of 0.0. Associated arousal with leg movement index was 0.0 .  IMPRESSIONS - Mild obstructive sleep apnea occurred during this study (AHI = 8.5/h). - No significant central sleep apnea occurred during this study (CAI = 1/h). - Mild oxygen desaturation was noted during this study (Min O2 = 81.0%).  Mean O2 saturation 90.1%. Time with O2 saturation 88% or less was 49.9 minutes. - No snoring was audible during this study. - EKG findings include PVCs. - Clinically significant periodic limb movements did not occur during sleep. No significant associated arousals.  DIAGNOSIS - Obstructive Sleep Apnea (G47.33) - Nocturnal Hypoxemia (G47.36)  RECOMMENDATIONS - Treatment for minimal OSA is directed by symptoms and comorbidities. Conservative measures may include observation, weight loss and sleep position off back.  Other options,including CPAP, a fitted oral appliiance or ENT evaluation, would be guided by clinical judgment. - Be careful with alcohol, sedatives and other CNS depressants that may worsen sleep apnea and disrupt normal sleep architecture. - Sleep hygiene should be reviewed to assess factors that may improve sleep quality. - Weight management and regular exercise should be initiated or continued if appropriate.  [Electronically signed] 07/28/2021 10:31 AM  Jetty Duhamel MD, ABSM Diplomate, American Board of Sleep Medicine   NPI: [id number]                         Jetty Duhamel Diplomate, American Board of Sleep Medicine  ELECTRONICALLY SIGNED ON:  07/28/2021, 10:25 AM [facility] PH: [phone]   FX: [phone] ACCREDITED BY THE AMERICAN ACADEMY OF SLEEP  MEDICINE

## 2021-07-31 ENCOUNTER — Ambulatory Visit: Payer: BC Managed Care – PPO | Admitting: Podiatry

## 2024-01-14 ENCOUNTER — Other Ambulatory Visit: Payer: Self-pay | Admitting: Orthopedic Surgery

## 2024-01-14 DIAGNOSIS — M545 Low back pain, unspecified: Secondary | ICD-10-CM

## 2024-01-15 ENCOUNTER — Telehealth: Payer: Self-pay

## 2024-01-15 NOTE — Telephone Encounter (Signed)
Phone call to patient to discuss order for intradiscal injection. Reviewed pts allergies and most recent weight. Pt was advised not to take oral antibiotics for this procedure that we would give him IV antibiotics prior. Discharge instructions also reviewed with patient and instructed patient to have a driver the day of the procedure. Pt verbalized understanding.  

## 2024-01-26 NOTE — Discharge Instructions (Signed)
Intra-Discal Anesthetic Injection Discharge Instruction Sheet  You may resume a regular diet and any medications that you routinely take (including pain medications).  No driving day of procedure.  Light activity throughout the rest of the day.  Do not do any strenuous work, exercise, bending or lifting.  The day following the procedure, you can resume normal physical activity but you should refrain from exercising or physical therapy for at least three days thereafter.   Please contact our office at [phone] for the following symptoms: Fever greater than 100 degrees. Headaches unresolved with medication after 2-3 days. 

## 2024-01-27 ENCOUNTER — Ambulatory Visit
Admission: RE | Admit: 2024-01-27 | Discharge: 2024-01-27 | Disposition: A | Discharge status: Home / Self Care | Payer: Worker's Compensation | Source: Ambulatory Visit | Attending: Orthopedic Surgery | Admitting: Orthopedic Surgery

## 2024-01-27 DIAGNOSIS — M545 Low back pain, unspecified: Secondary | ICD-10-CM

## 2024-01-27 MED ORDER — IOPAMIDOL (ISOVUE-M 200) INJECTION 41%
1.0000 mL | Freq: Once | INTRAMUSCULAR | Status: AC
  Administered 2024-01-27: 1 mL

## 2024-01-27 MED ORDER — CEFAZOLIN SODIUM-DEXTROSE 2-4 GM/100ML-% IV SOLN
2.0000 g | INTRAVENOUS | Status: AC
  Administered 2024-01-27: 2 g via INTRAVENOUS

## 2024-01-27 MED ORDER — SODIUM CHLORIDE 0.9 % IV SOLN: Freq: Once | INTRAVENOUS | Status: DC
